# Patient Record
Sex: Female | Born: 1980 | Race: White | Hispanic: Yes | Marital: Married | Smoking: Never smoker
Health system: Southern US, Community
[De-identification: ages and names within clinical notes are randomized; demographics above are authoritative.]

---
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
## 2003-12-08 ENCOUNTER — Inpatient Hospital Stay (HOSPITAL_COMMUNITY): Admission: AD | Admit: 2003-12-08 | Discharge: 2003-12-10 | Payer: Self-pay | Admitting: *Deleted

## 2003-12-08 ENCOUNTER — Ambulatory Visit: Payer: Self-pay | Admitting: *Deleted

## 2004-06-01 ENCOUNTER — Emergency Department (HOSPITAL_COMMUNITY): Admission: EM | Admit: 2004-06-01 | Discharge: 2004-06-02 | Payer: Self-pay | Admitting: Emergency Medicine

## 2005-03-12 ENCOUNTER — Inpatient Hospital Stay (HOSPITAL_COMMUNITY): Admission: AD | Admit: 2005-03-12 | Discharge: 2005-03-12 | Payer: Self-pay | Admitting: Obstetrics & Gynecology

## 2005-09-26 ENCOUNTER — Inpatient Hospital Stay (HOSPITAL_COMMUNITY): Admission: AD | Admit: 2005-09-26 | Discharge: 2005-09-29 | Payer: Self-pay | Admitting: *Deleted

## 2006-11-04 ENCOUNTER — Inpatient Hospital Stay (HOSPITAL_COMMUNITY): Admission: AD | Admit: 2006-11-04 | Discharge: 2006-11-07 | Payer: Self-pay | Admitting: Obstetrics & Gynecology

## 2010-10-29 LAB — HEPATITIS B SURFACE ANTIGEN: Hepatitis B Surface Ag: NEGATIVE

## 2010-10-29 LAB — DIFFERENTIAL
Eosinophils Relative: 1
Monocytes Absolute: 0.6
Neutro Abs: 7.5
Neutrophils Relative %: 76

## 2010-10-29 LAB — CBC
Platelets: 314
RBC: 2.82 — ABNORMAL LOW
RBC: 3.89
WBC: 9.8

## 2010-10-29 LAB — TYPE AND SCREEN
ABO/RH(D): O POS
Antibody Screen: NEGATIVE

## 2010-10-29 LAB — RUBELLA SCREEN: Rubella: 73.4 — ABNORMAL HIGH

## 2017-10-15 ENCOUNTER — Ambulatory Visit (INDEPENDENT_AMBULATORY_CARE_PROVIDER_SITE_OTHER): Payer: Self-pay

## 2017-10-15 ENCOUNTER — Other Ambulatory Visit: Payer: Self-pay

## 2017-10-15 ENCOUNTER — Encounter (HOSPITAL_COMMUNITY): Payer: Self-pay

## 2017-10-15 ENCOUNTER — Ambulatory Visit (HOSPITAL_COMMUNITY)
Admission: EM | Admit: 2017-10-15 | Discharge: 2017-10-15 | Disposition: A | Discharge status: Home / Self Care | Payer: Self-pay | Attending: Family Medicine | Admitting: Family Medicine

## 2017-10-15 DIAGNOSIS — K5901 Slow transit constipation: Secondary | ICD-10-CM

## 2017-10-15 DIAGNOSIS — R1084 Generalized abdominal pain: Secondary | ICD-10-CM

## 2017-10-15 LAB — POCT URINALYSIS DIP (DEVICE)
BILIRUBIN URINE: NEGATIVE
GLUCOSE, UA: NEGATIVE mg/dL
Ketones, ur: NEGATIVE mg/dL
LEUKOCYTES UA: NEGATIVE
NITRITE: NEGATIVE
Protein, ur: NEGATIVE mg/dL
Specific Gravity, Urine: 1.01 (ref 1.005–1.030)
UROBILINOGEN UA: 0.2 mg/dL (ref 0.0–1.0)
pH: 7 (ref 5.0–8.0)

## 2017-10-15 LAB — POCT PREGNANCY, URINE: Preg Test, Ur: NEGATIVE

## 2017-10-15 MED ORDER — POLYETHYLENE GLYCOL 3350 17 GM/SCOOP PO POWD: 17.0000 g | Freq: Three times a day (TID) | ORAL | 0 refills | Status: AC | PRN

## 2017-10-15 NOTE — ED Provider Notes (Signed)
[facility]    CSN: [id number] Arrival date & time: 10/15/17  1803     History   Chief Complaint Chief Complaint  Patient presents with  . Abdominal Pain    HPI Hailey Webster is a 37 y.o. female.   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  Minimal BM this past week.  Feels she cannot take a deep breath.   Patient has had similar problem in the past.  Poor appetite, no fever.     History reviewed. No pertinent past medical history.  There are no active problems to display for this patient.   History reviewed. No pertinent surgical history.  OB History   None      Home Medications    Prior to Admission medications   Not on File    Family History History reviewed. No pertinent family history.  Social History Social History   Tobacco Use  . Smoking status: Never Smoker  . Smokeless tobacco: Never Used  Substance Use Topics  . Alcohol use: Not on file  . Drug use: Not on file     Allergies   Patient has no allergy information on record.   Review of Systems Review of Systems  Respiratory: Positive for shortness of breath.   Cardiovascular: Negative for chest pain.  Gastrointestinal: Positive for abdominal pain and constipation. Negative for vomiting.  All other systems reviewed and are negative.    Physical Exam Triage Vital Signs ED Triage Vitals [10/15/17 1903]  Enc Vitals Group     BP      Pulse      Resp      Temp      Temp src      SpO2      Weight 150 lb (68 kg)     Height      Head Circumference      Peak Flow      Pain Score      Pain Loc      Pain Edu?      Excl. in GC?    No data found.  Updated Vital Signs Wt 68 kg   LMP 10/15/2017    Physical Exam  Constitutional: She appears well-developed and well-nourished.  HENT:  Head: Normocephalic.  Mouth/Throat: Oropharynx is clear and moist.  Eyes: Pupils are equal, round, and reactive to  light. EOM are normal.  Cardiovascular: Normal rate, regular rhythm and normal heart sounds.  Pulmonary/Chest: Effort normal and breath sounds normal.  Abdominal: Soft. Normal appearance. She exhibits distension. Bowel sounds are increased. There is no splenomegaly or hepatomegaly.  Skin: Skin is warm and dry.  Nursing note and vitals reviewed.    UC Treatments / Results  Labs (all labs ordered are listed, but only abnormal results are displayed) Labs Reviewed  POCT URINALYSIS DIP (DEVICE) - Abnormal; Notable for the following components:      Result Value   Hgb urine dipstick MODERATE (*)    All other components within normal limits  POCT PREGNANCY, URINE    EKG None  Radiology No results found.  Procedures Procedures (including critical care time)  Medications Ordered in UC Medications - No data to display  Initial Impression / Assessment and Plan / UC Course  I have reviewed the triage vital signs and the nursing notes.  Pertinent labs & imaging results that were available during my care of the patient were reviewed by me and considered in my medical  decision making (see chart for details).     Final Clinical Impressions(s) / UC Diagnoses   Final diagnoses:  None   Discharge Instructions   None    ED Prescriptions    None     Controlled Substance Prescriptions [place] Controlled Substance Registry consulted? Not Applicable   Elvina Sidle, MD 10/15/17 2039

## 2017-10-15 NOTE — ED Triage Notes (Signed)
Pt states she has a stomach pain x 1 week.

## 2019-03-31 IMAGING — DX DG ABDOMEN 1V
1 series · 1 of 1 positions shown · non-contrast
Comparison: None.

CLINICAL DATA: Acute generalized abdominal pain.

EXAM:
ABDOMEN - 1 VIEW

[abdomen kub]
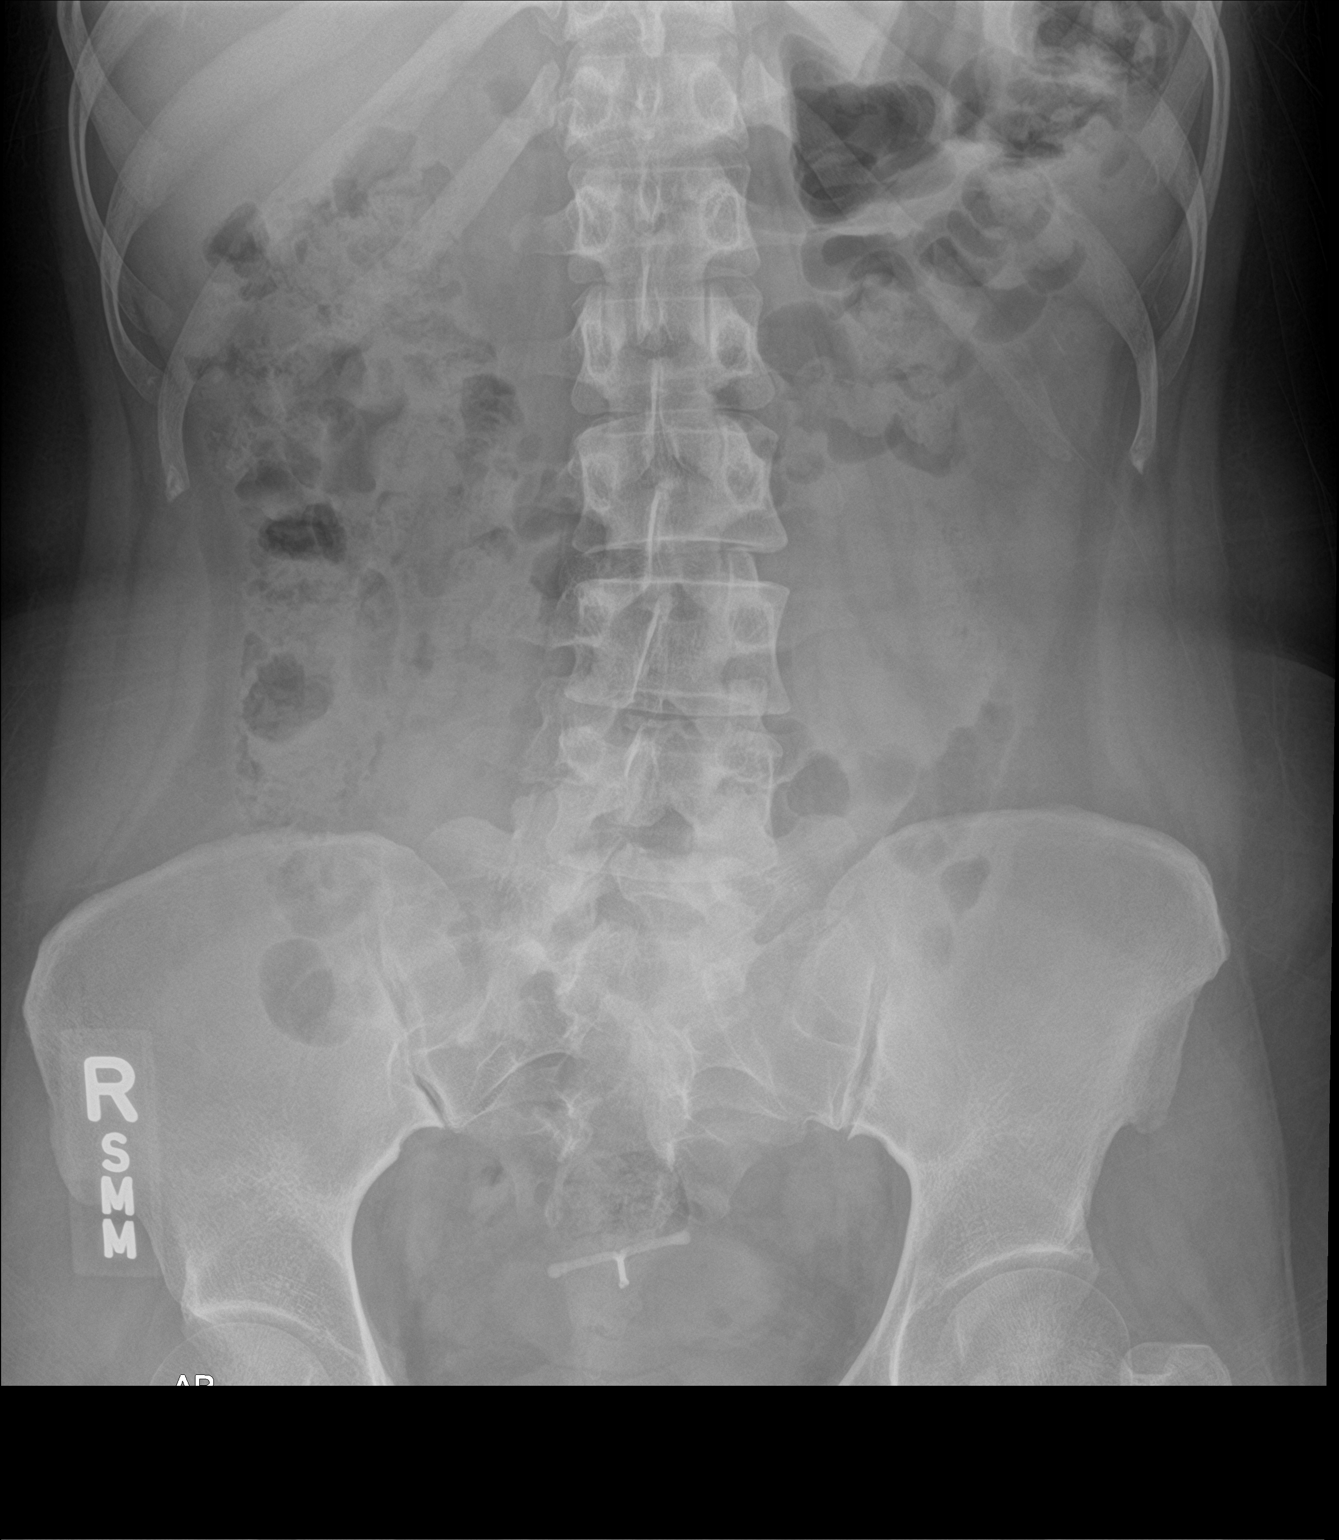

[1 of 1 positions shown; findings below may reference images not displayed]

FINDINGS: The bowel gas pattern is normal. No radio-opaque calculi or other
significant radiographic abnormality are seen. Intrauterine device
is noted in pelvis.
IMPRESSION: No evidence of bowel obstruction or ileus.

## 2021-08-06 ENCOUNTER — Emergency Department (HOSPITAL_COMMUNITY): Payer: Self-pay

## 2021-08-06 ENCOUNTER — Encounter (HOSPITAL_COMMUNITY): Payer: Self-pay | Admitting: Emergency Medicine

## 2021-08-06 ENCOUNTER — Emergency Department (HOSPITAL_COMMUNITY)
Admission: EM | Admit: 2021-08-06 | Discharge: 2021-08-06 | Disposition: A | Discharge status: Home / Self Care | Payer: Self-pay | Attending: Emergency Medicine | Admitting: Emergency Medicine

## 2021-08-06 DIAGNOSIS — Y990 Civilian activity done for income or pay: Secondary | ICD-10-CM | POA: Insufficient documentation

## 2021-08-06 DIAGNOSIS — Y9301 Activity, walking, marching and hiking: Secondary | ICD-10-CM | POA: Insufficient documentation

## 2021-08-06 DIAGNOSIS — W010XXA Fall on same level from slipping, tripping and stumbling without subsequent striking against object, initial encounter: Secondary | ICD-10-CM | POA: Insufficient documentation

## 2021-08-06 DIAGNOSIS — W19XXXA Unspecified fall, initial encounter: Secondary | ICD-10-CM

## 2021-08-06 DIAGNOSIS — S7002XA Contusion of left hip, initial encounter: Secondary | ICD-10-CM | POA: Insufficient documentation

## 2021-08-06 LAB — PREGNANCY, URINE: Preg Test, Ur: NEGATIVE

## 2021-08-06 NOTE — ED Triage Notes (Signed)
Patient complains of left hip pain that started after falling at work last week. Patient has been able to walk since the incident but states walking is painful. Patient is alert, oriented, ambulatory, and in no apparent distress at this time.

## 2021-08-06 NOTE — ED Provider Notes (Signed)
[facility] EMERGENCY DEPARTMENT Provider Note   CSN: [id number] Arrival date & time: 08/06/21  0944     History  Chief Complaint  Patient presents with   Hailey Webster    Hailey Webster is a 41 y.o. female.  Patient presents with left hip pain since falling at work few days ago.  Patient was walking and slipped on the floor landing on her left hip.  Patient has no neuro symptoms or signs.  No history of hip surgeries.  Pain with walking.  No head injury back chest or abdominal injury.  Patient tried over-the-counter medicines.       Home Medications Prior to Admission medications   Medication Sig Start Date End Date Taking? Authorizing Provider  polyethylene glycol powder (MIRALAX) powder Take 17 g by mouth 3 (three) times daily as needed. 10/15/17   Elvina Sidle, MD      Allergies    Patient has no known allergies.    Review of Systems   Review of Systems  Constitutional:  Negative for chills and fever.  HENT:  Negative for congestion.   Eyes:  Negative for visual disturbance.  Respiratory:  Negative for shortness of breath.   Cardiovascular:  Negative for chest pain.  Gastrointestinal:  Negative for abdominal pain and vomiting.  Genitourinary:  Negative for dysuria and flank pain.  Musculoskeletal:  Positive for gait problem. Negative for back pain, neck pain and neck stiffness.  Skin:  Negative for rash.  Neurological:  Negative for light-headedness and headaches.    Physical Exam Updated Vital Signs BP 111/68 (BP Location: Right Arm)   Pulse 76   Temp 98.1 F (36.7 C) (Oral)   Resp 16   SpO2 98%  Physical Exam Vitals and nursing note reviewed.  Constitutional:      General: She is not in acute distress.    Appearance: She is well-developed.  HENT:     Head: Normocephalic and atraumatic.     Mouth/Throat:     Mouth: Mucous membranes are moist.  Eyes:     General:        Right eye: No discharge.        Left eye: No discharge.      Conjunctiva/sclera: Conjunctivae normal.  Neck:     Trachea: No tracheal deviation.  Cardiovascular:     Rate and Rhythm: Normal rate.  Pulmonary:     Effort: Pulmonary effort is normal.  Abdominal:     General: There is no distension.     Tenderness: There is no abdominal tenderness. There is no guarding.  Musculoskeletal:        General: Tenderness present.     Cervical back: Normal range of motion.     Comments: Patient has tenderness to palpation of left iliac crest and lateral left hip and pain with extreme flexion of the hip.  No deformity.  Patient can flex knee without difficulty.  Skin:    General: Skin is warm.     Capillary Refill: Capillary refill takes less than 2 seconds.  Neurological:     General: No focal deficit present.     Mental Status: She is alert.  Psychiatric:        Mood and Affect: Mood normal.     ED Results / Procedures / Treatments   Labs (all labs ordered are listed, but only abnormal results are displayed) Labs Reviewed  PREGNANCY, URINE    EKG None  Radiology DG Hip Unilat With Pelvis 2-3 Views Left  Result Date: 08/06/2021 CLINICAL DATA:  Left hip pain after fall at work last week. EXAM: DG HIP (WITH OR WITHOUT PELVIS) 2-3V LEFT COMPARISON:  None Available. FINDINGS: Bony pelvis and bilateral hip joints have a normal appearance. There is no evidence of hip fracture or dislocation. There is no evidence of arthropathy or other focal bone abnormality. There is an IUD in place. IMPRESSION: Negative. Electronically Signed   By: Marjo Bicker M.D.   On: 08/06/2021 11:37    Procedures Procedures    Medications Ordered in ED Medications - No data to display  ED Course/ Medical Decision Making/ A&P                           Medical Decision Making Amount and/or Complexity of Data Reviewed Radiology: ordered.   Patient presents with isolated left hip pain from a fall at work.  Differential includes contusion, musculoskeletal, hip  fracture, iliac crest fracture, other.  X-ray ordered and reviewed independently no acute fracture.  Discussed supportive care work note given.  Patient comfortable with plan.        Final Clinical Impression(s) / ED Diagnoses Final diagnoses:  Contusion of left hip, initial encounter  Fall, initial encounter    Rx / DC Orders ED Discharge Orders     None         Blane Ohara, MD 08/06/21 1151

## 2021-08-06 NOTE — ED Provider Triage Note (Signed)
Emergency Medicine Provider Triage Evaluation Note  Hailey Webster , a 41 y.o. female  was evaluated in triage.  Pt complains of left hip/pelvis pain.  Happened a week ago when she fell and landed on her bottom/left hip.  Denies any urinary retention or incontinence, inability to ambulate.  Has tried Tylenol and that is helped somewhat  Review of Systems  Per HPI  Physical Exam  BP 111/68 (BP Location: Right Arm)   Pulse 76   Temp 98.1 F (36.7 C) (Oral)   Resp 16   SpO2 98%  Gen:   Awake, no distress   Resp:  Normal effort  MSK:   Moves extremities without difficulty  Other:  Left hip tender to palpation.  Tolerates ROM  Medical Decision Making  Medically screening exam initiated at 10:15 AM.  Appropriate orders placed.  Khushi Zupko was informed that the remainder of the evaluation will be completed by another provider, this initial triage assessment does not replace that evaluation, and the importance of remaining in the ED until their evaluation is complete.     Theron Arista, PA-C 08/06/21 1017
